# Patient Record
Sex: Female | Born: 1965 | Race: White | Hispanic: No | Marital: Married | Smoking: Current every day smoker
Health system: Southern US, Community
[De-identification: ages and names within clinical notes are randomized; demographics above are authoritative.]

## PROBLEM LIST (undated history)

## (undated) DIAGNOSIS — E785 Hyperlipidemia, unspecified: Secondary | ICD-10-CM

## (undated) DIAGNOSIS — K219 Gastro-esophageal reflux disease without esophagitis: Secondary | ICD-10-CM

## (undated) HISTORY — PX: CHOLECYSTECTOMY: SHX55

## (undated) HISTORY — PX: ABDOMINAL HYSTERECTOMY: SHX81

## (undated) HISTORY — PX: TONSILLECTOMY: SUR1361

---

## 2000-07-02 ENCOUNTER — Encounter: Admission: RE | Admit: 2000-07-02 | Discharge: 2000-07-02 | Payer: Self-pay | Admitting: Gastroenterology

## 2000-07-02 ENCOUNTER — Encounter: Payer: Self-pay | Admitting: Gastroenterology

## 2007-11-28 ENCOUNTER — Observation Stay (HOSPITAL_COMMUNITY): Admission: EM | Admit: 2007-11-28 | Discharge: 2007-11-29 | Payer: Self-pay | Admitting: Emergency Medicine
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
## 2011-07-17 LAB — DIFFERENTIAL
Basophils Relative: 0
Eosinophils Absolute: 0
Eosinophils Relative: 0
Monocytes Absolute: 2 — ABNORMAL HIGH
Neutro Abs: 24.9 — ABNORMAL HIGH
Neutrophils Relative %: 87 — ABNORMAL HIGH

## 2011-07-17 LAB — BASIC METABOLIC PANEL
CO2: 21
Chloride: 107
Creatinine, Ser: 0.72
GFR calc Af Amer: >60
Potassium: 3.5
Sodium: 137

## 2011-07-17 LAB — CBC
HCT: 42.5
Hemoglobin: 14.5
MCHC: 34.2
MCV: 91
RBC: 4.66
WBC: 28.6 — ABNORMAL HIGH

## 2011-07-17 LAB — SAMPLE TO BLOOD BANK

## 2017-11-19 ENCOUNTER — Other Ambulatory Visit: Payer: Self-pay | Admitting: Obstetrics & Gynecology

## 2017-11-19 DIAGNOSIS — R928 Other abnormal and inconclusive findings on diagnostic imaging of breast: Secondary | ICD-10-CM

## 2017-11-26 ENCOUNTER — Ambulatory Visit
Admission: RE | Admit: 2017-11-26 | Discharge: 2017-11-26 | Disposition: A | Discharge status: Home / Self Care | Payer: BLUE CROSS/BLUE SHIELD | Source: Ambulatory Visit | Attending: Obstetrics & Gynecology | Admitting: Obstetrics & Gynecology

## 2017-11-26 ENCOUNTER — Other Ambulatory Visit: Payer: Self-pay | Admitting: Obstetrics & Gynecology

## 2017-11-26 DIAGNOSIS — R928 Other abnormal and inconclusive findings on diagnostic imaging of breast: Secondary | ICD-10-CM

## 2017-11-26 DIAGNOSIS — N6320 Unspecified lump in the left breast, unspecified quadrant: Secondary | ICD-10-CM

## 2017-11-30 ENCOUNTER — Other Ambulatory Visit: Payer: Self-pay | Admitting: Obstetrics & Gynecology

## 2017-11-30 DIAGNOSIS — N63 Unspecified lump in unspecified breast: Secondary | ICD-10-CM

## 2018-05-26 ENCOUNTER — Inpatient Hospital Stay: Admission: RE | Admit: 2018-05-26 | Payer: BLUE CROSS/BLUE SHIELD | Source: Ambulatory Visit

## 2018-07-23 IMAGING — MG 2D DIGITAL DIAGNOSTIC BILATERAL MAMMOGRAM WITH CAD AND ADJUNCT T
8 of 9 series · 8 of 13 positions shown · non-contrast
Comparison: Previous exam(s).

CLINICAL DATA: 51-year-old female recalled from baseline screening
mammogram dated 11/17/2017 for bilateral breast masses.

EXAM:
2D DIGITAL DIAGNOSTIC BILATERAL MAMMOGRAM WITH CAD AND ADJUNCT TOMO
ULTRASOUND BILATERAL BREAST

[R CC]
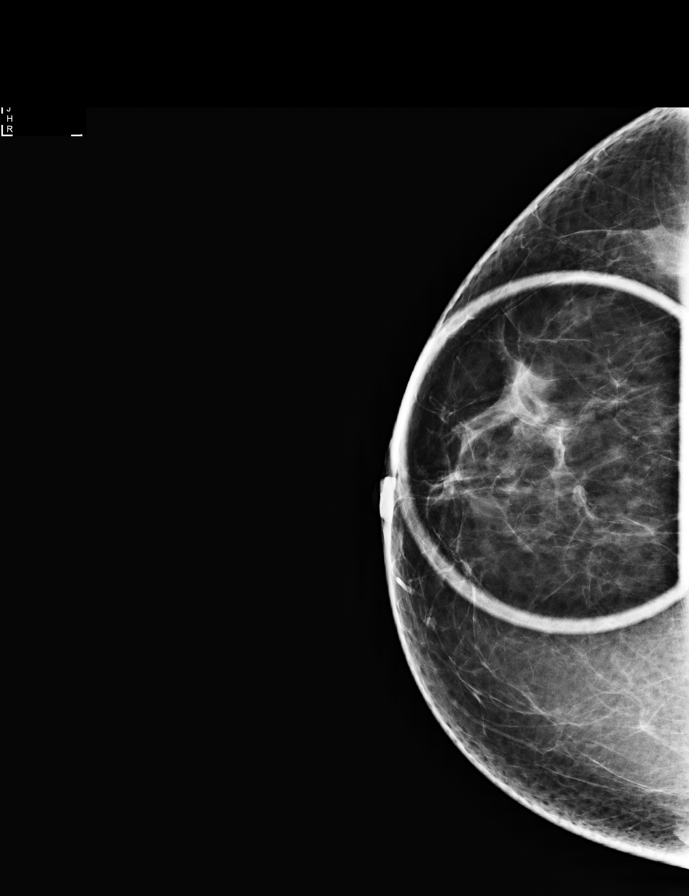

[L MLO]
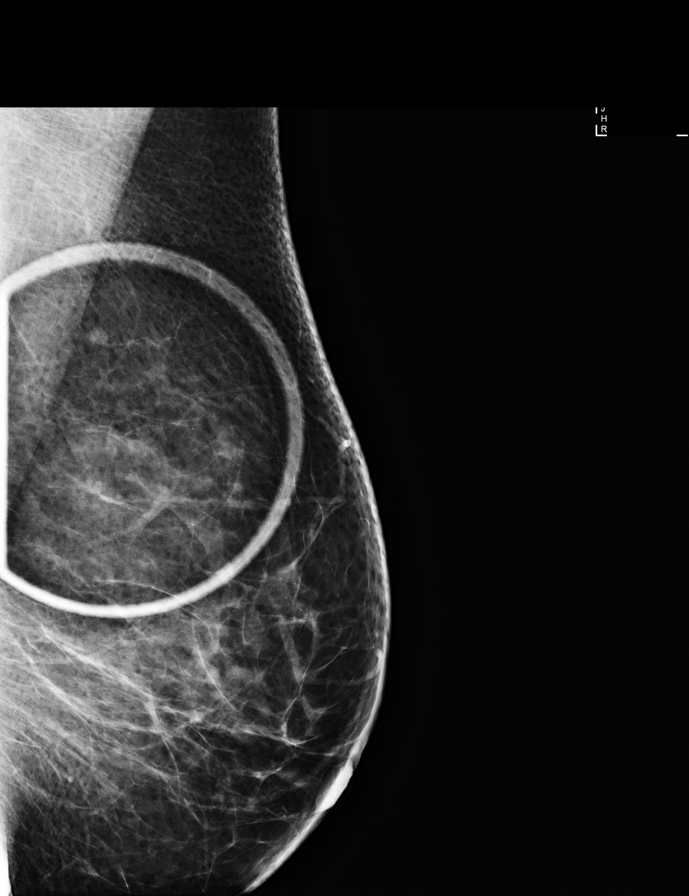

[L ML]
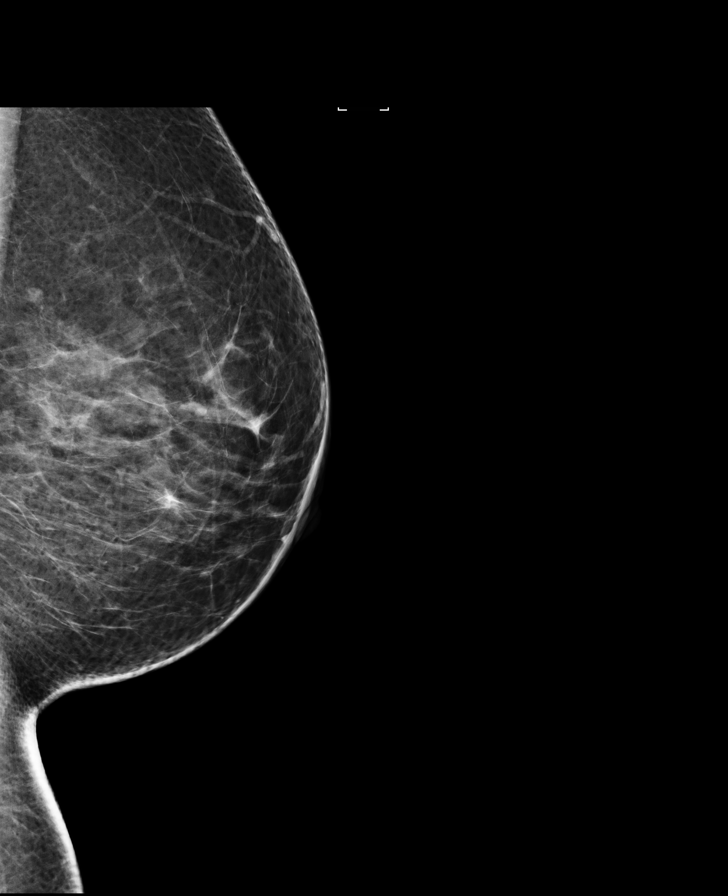

[L MLO synth-2D]
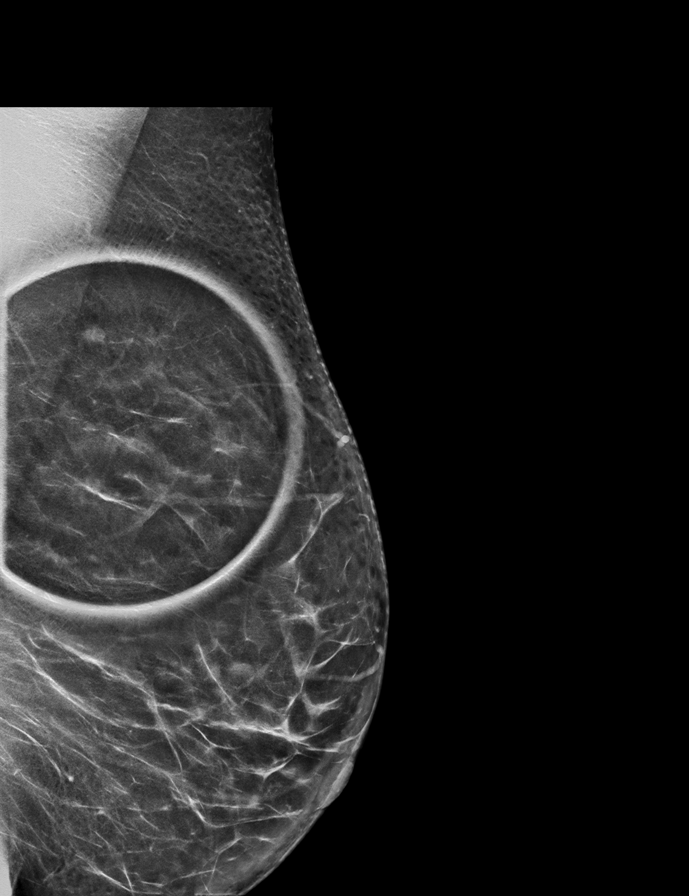

[R MLO synth-2D]
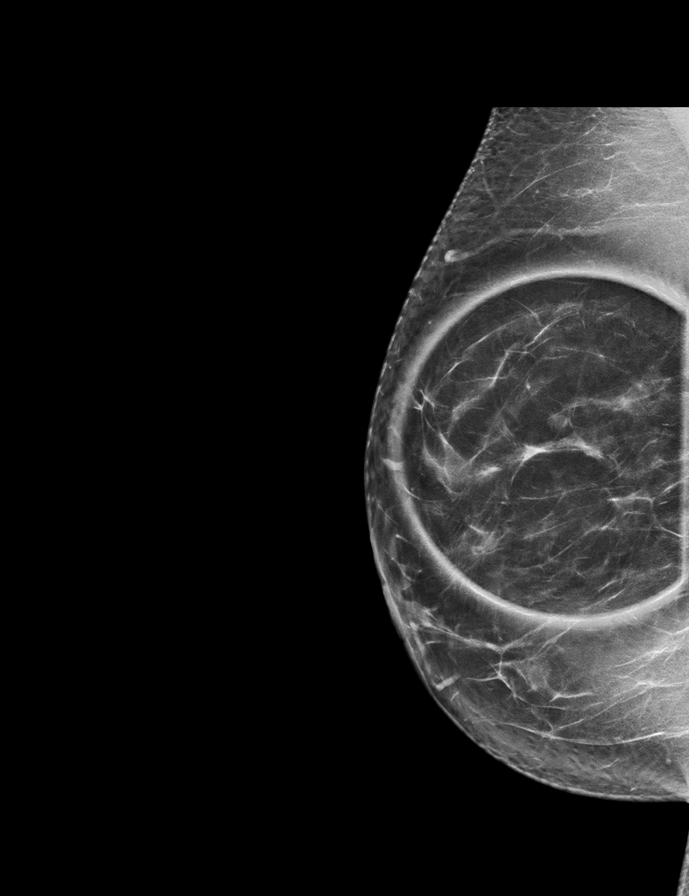

[L ML synth-2D]
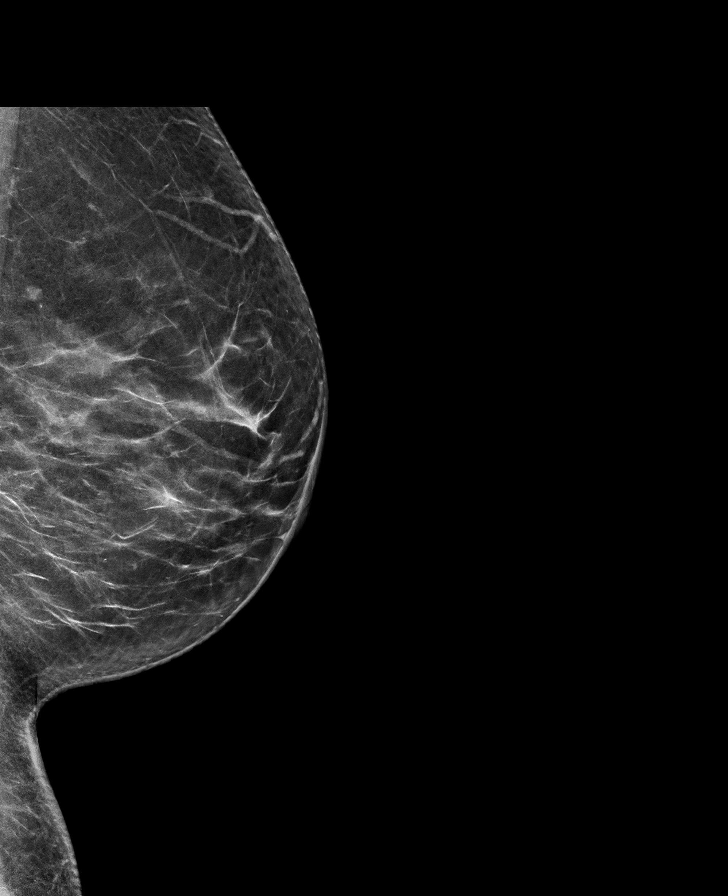

[R MLO]
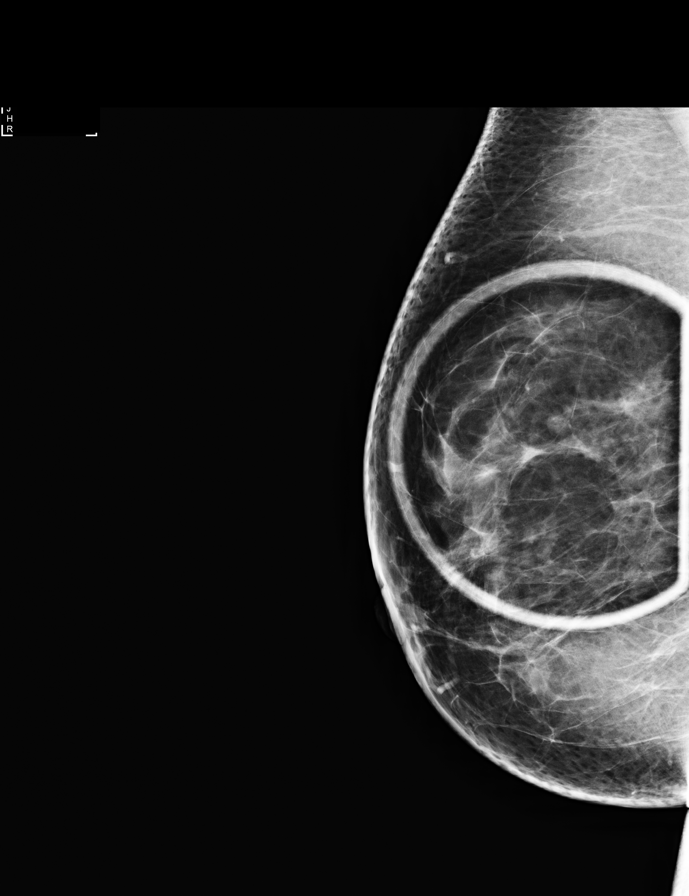

[R CC synth-2D]
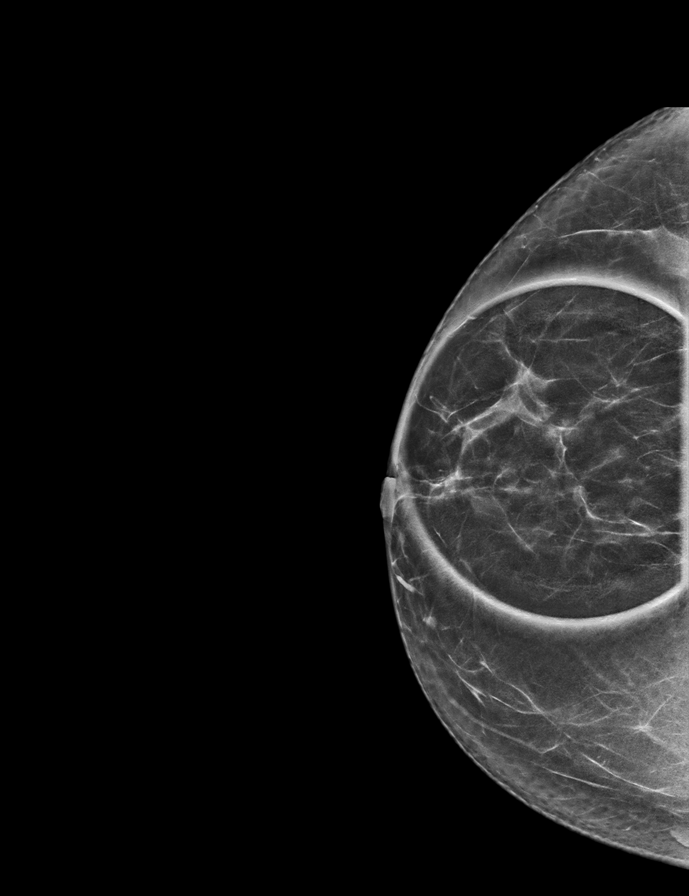

[8 of 13 positions shown; findings below may reference images not displayed]

ACR Breast Density Category b: There are scattered areas of
fibroglandular density.
FINDINGS: Oval, circumscribed equal density masses persist in the superior
central right breast at middle depth and superior left breast at far
posterior depth on the MLO projection. This localizes laterally on
tomosynthesis. Further evaluation of both areas was performed with
ultrasound.

Mammographic images were processed with CAD.

Targeted ultrasound is performed, showing a benign appearing nearly
anechoic cyst at the 12 o'clock position 3 cm from the nipple on the
right. It measures 0.5 x 0.3 x 0.3 cm. There is no internal
vascularity. This corresponds with the mammographic findings.

A probable complicated cyst is identified at within the left breast
at the 2 o'clock position 10 cm from the nipple. It measures 0.5 x
0.4 x 0.2 cm. There is no internal vascularity. This likely
corresponds with the mammographic finding. Extensive evaluation of
the far posterior upper outer quadrant demonstrates no additional
suspicious findings.
IMPRESSION: 1. Probably benign probable complicated cyst of the upper outer left
breast. Recommendation is for six-month mammographic and sonographic
follow-up.
2. Benign right breast cyst corresponding with the screening
mammographic findings. No further imaging follow-up required.

RECOMMENDATION:
Diagnostic left breast mammogram and ultrasound in 6 months.

I have discussed the findings and recommendations with the patient.
Results were also provided in writing at the conclusion of the
visit. If applicable, a reminder letter will be sent to the patient
regarding the next appointment.

BI-RADS CATEGORY  3: Probably benign.

## 2018-11-15 ENCOUNTER — Ambulatory Visit
Admission: RE | Admit: 2018-11-15 | Discharge: 2018-11-15 | Disposition: A | Discharge status: Home / Self Care | Payer: BLUE CROSS/BLUE SHIELD | Source: Ambulatory Visit | Attending: Obstetrics & Gynecology | Admitting: Obstetrics & Gynecology

## 2018-11-15 ENCOUNTER — Other Ambulatory Visit: Payer: Self-pay | Admitting: Obstetrics & Gynecology

## 2018-11-15 ENCOUNTER — Ambulatory Visit: Payer: BLUE CROSS/BLUE SHIELD

## 2018-11-15 DIAGNOSIS — N6320 Unspecified lump in the left breast, unspecified quadrant: Secondary | ICD-10-CM

## 2018-11-15 DIAGNOSIS — N63 Unspecified lump in unspecified breast: Secondary | ICD-10-CM

## 2018-11-18 ENCOUNTER — Other Ambulatory Visit: Payer: Self-pay | Admitting: Obstetrics & Gynecology

## 2018-11-18 DIAGNOSIS — N63 Unspecified lump in unspecified breast: Secondary | ICD-10-CM

## 2018-11-22 ENCOUNTER — Ambulatory Visit
Admission: RE | Admit: 2018-11-22 | Discharge: 2018-11-22 | Disposition: A | Discharge status: Home / Self Care | Payer: BLUE CROSS/BLUE SHIELD | Source: Ambulatory Visit | Attending: Obstetrics & Gynecology | Admitting: Obstetrics & Gynecology

## 2018-11-22 ENCOUNTER — Ambulatory Visit: Payer: BLUE CROSS/BLUE SHIELD

## 2018-11-22 DIAGNOSIS — N63 Unspecified lump in unspecified breast: Secondary | ICD-10-CM

## 2019-07-19 IMAGING — MG DIGITAL DIAGNOSTIC BILATERAL MAMMOGRAM WITH TOMO AND CAD
2 series · 3 of 6 positions shown · non-contrast
Comparison: Previous exam(s).

CLINICAL DATA: Delayed follow-up for a probably benign left breast
mass.

EXAM:
DIGITAL DIAGNOSTIC BILATERAL MAMMOGRAM WITH CAD AND TOMO

[L CC synth-2D]
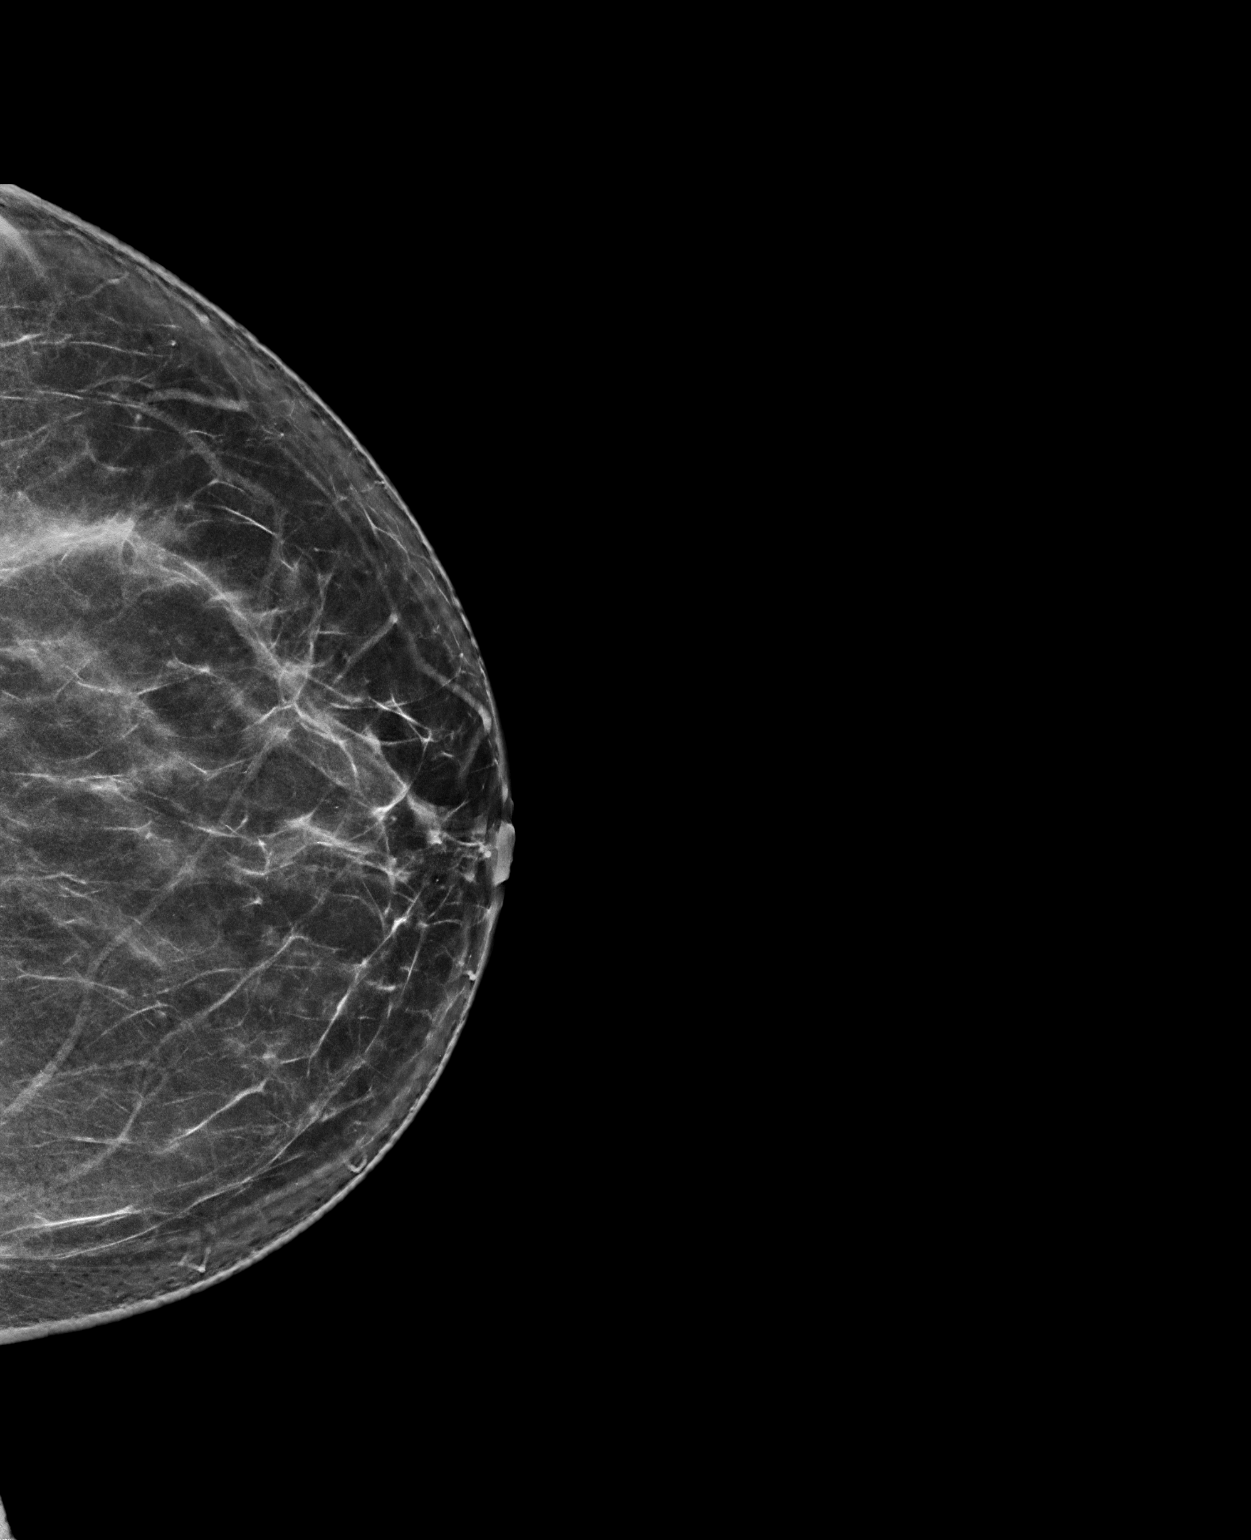

[R MLO tomo · 2 of 88 frames shown]
[frame 29/88]
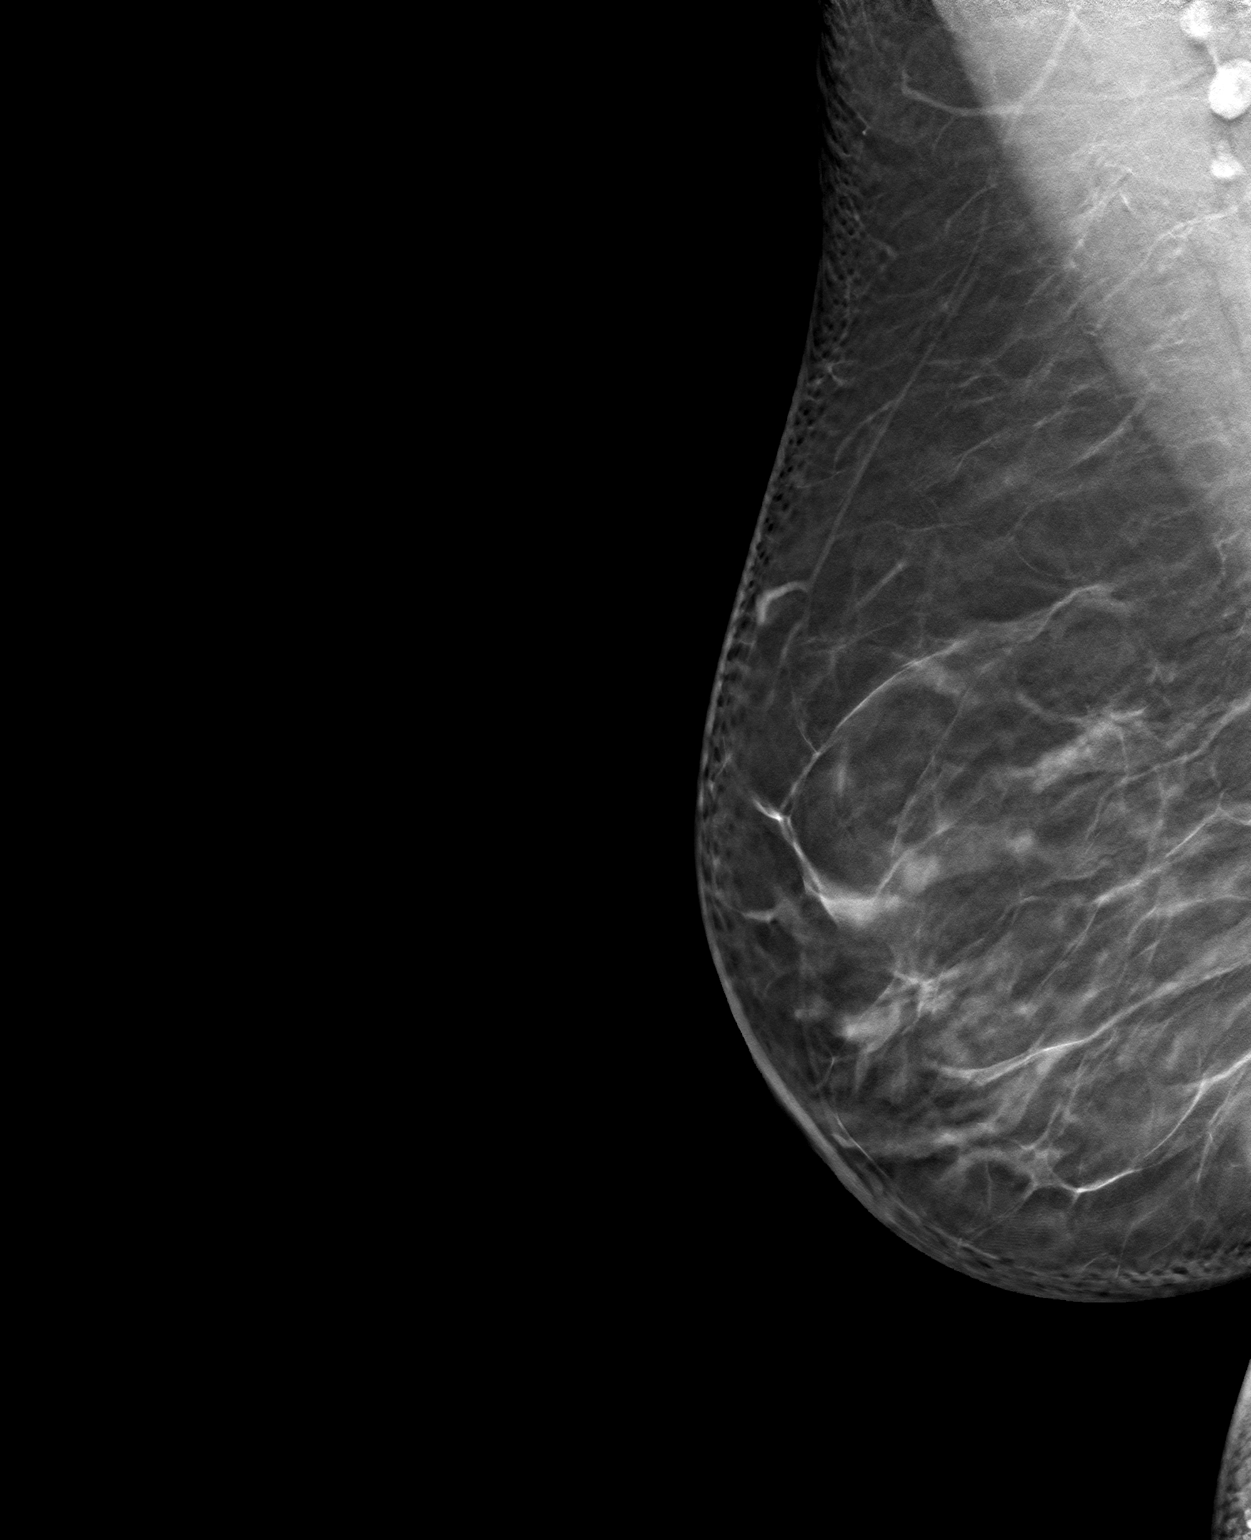
[frame 45/88]
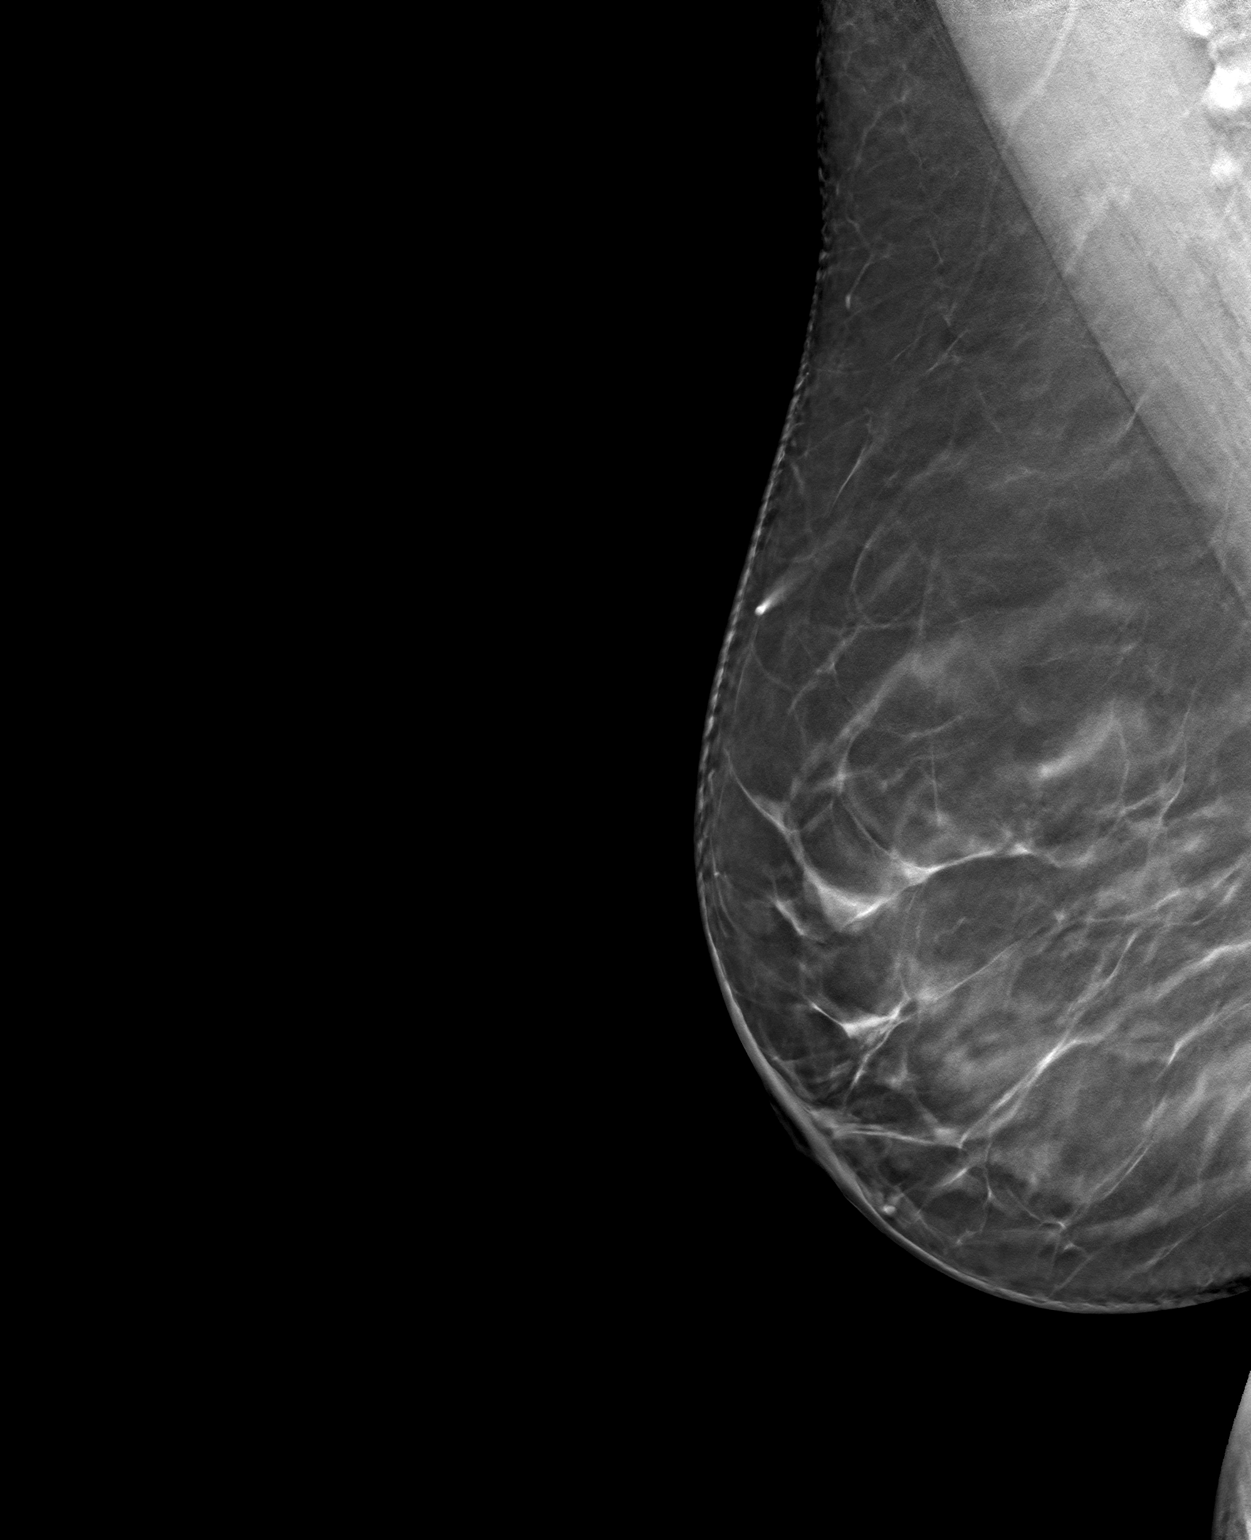

[3 of 6 positions shown; findings below may reference images not displayed]

ACR Breast Density Category b: There are scattered areas of
fibroglandular density.
FINDINGS: The small mass previously seen in the upper-outer quadrant of the
left breast has resolved. No suspicious calcifications, masses or
areas of distortion are seen in the bilateral breasts.

Mammographic images were processed with CAD.
IMPRESSION: 1.  Resolution of the small mass in the upper-outer left breast.

2.  No mammographic evidence of malignancy in the bilateral breasts.

RECOMMENDATION:
Screening mammogram in one year.(Code:[id number])

I have discussed the findings and recommendations with the patient.
Results were also provided in writing at the conclusion of the
visit. If applicable, a reminder letter will be sent to the patient
regarding the next appointment.

BI-RADS CATEGORY  1: Negative.

## 2022-07-13 ENCOUNTER — Emergency Department (HOSPITAL_COMMUNITY)
Admission: EM | Admit: 2022-07-13 | Discharge: 2022-07-14 | Discharge status: Against Medical Advice | Payer: BC Managed Care – PPO | Attending: Physician Assistant | Admitting: Physician Assistant

## 2022-07-13 ENCOUNTER — Encounter (HOSPITAL_COMMUNITY): Payer: Self-pay

## 2022-07-13 ENCOUNTER — Other Ambulatory Visit: Payer: Self-pay

## 2022-07-13 ENCOUNTER — Emergency Department (HOSPITAL_COMMUNITY): Payer: BC Managed Care – PPO

## 2022-07-13 ENCOUNTER — Ambulatory Visit
Admission: EM | Admit: 2022-07-13 | Discharge: 2022-07-13 | Disposition: A | Discharge status: Home / Self Care | Payer: BC Managed Care – PPO

## 2022-07-13 DIAGNOSIS — R059 Cough, unspecified: Secondary | ICD-10-CM | POA: Insufficient documentation

## 2022-07-13 DIAGNOSIS — R2243 Localized swelling, mass and lump, lower limb, bilateral: Secondary | ICD-10-CM | POA: Insufficient documentation

## 2022-07-13 DIAGNOSIS — R509 Fever, unspecified: Secondary | ICD-10-CM | POA: Insufficient documentation

## 2022-07-13 DIAGNOSIS — R0789 Other chest pain: Secondary | ICD-10-CM | POA: Insufficient documentation

## 2022-07-13 DIAGNOSIS — Z5321 Procedure and treatment not carried out due to patient leaving prior to being seen by health care provider: Secondary | ICD-10-CM | POA: Diagnosis not present

## 2022-07-13 DIAGNOSIS — R079 Chest pain, unspecified: Secondary | ICD-10-CM | POA: Diagnosis present

## 2022-07-13 DIAGNOSIS — R0602 Shortness of breath: Secondary | ICD-10-CM | POA: Diagnosis not present

## 2022-07-13 DIAGNOSIS — R000 Tachycardia, unspecified: Secondary | ICD-10-CM | POA: Diagnosis not present

## 2022-07-13 HISTORY — DX: Hyperlipidemia, unspecified: E78.5

## 2022-07-13 HISTORY — DX: Gastro-esophageal reflux disease without esophagitis: K21.9

## 2022-07-13 LAB — BASIC METABOLIC PANEL
Anion gap: 11 (ref 5–15)
BUN: 9 mg/dL (ref 6–20)
CO2: 24 mmol/L (ref 22–32)
Calcium: 9.4 mg/dL (ref 8.9–10.3)
Chloride: 104 mmol/L (ref 98–111)
Creatinine, Ser: 0.83 mg/dL (ref 0.44–1.00)
GFR, Estimated: >60 mL/min (ref >60)
Glucose, Bld: 145 mg/dL — ABNORMAL HIGH (ref 70–99)
Potassium: 3.9 mmol/L (ref 3.5–5.1)
Sodium: 139 mmol/L (ref 135–145)

## 2022-07-13 LAB — CBC WITH DIFFERENTIAL/PLATELET
Abs Immature Granulocytes: 0.07 10*3/uL (ref 0.00–0.07)
Basophils Absolute: 0.1 10*3/uL (ref 0.0–0.1)
Basophils Relative: 1 %
Eosinophils Absolute: 0.2 10*3/uL (ref 0.0–0.5)
Eosinophils Relative: 2 %
HCT: 41.9 % (ref 36.0–46.0)
Hemoglobin: 13.5 g/dL (ref 12.0–15.0)
Immature Granulocytes: 1 %
Lymphocytes Relative: 25 %
Lymphs Abs: 2.5 10*3/uL (ref 0.7–4.0)
MCH: 29.9 pg (ref 26.0–34.0)
MCHC: 32.2 g/dL (ref 30.0–36.0)
MCV: 92.7 fL (ref 80.0–100.0)
Monocytes Absolute: 0.6 10*3/uL (ref 0.1–1.0)
Monocytes Relative: 6 %
Neutro Abs: 6.5 10*3/uL (ref 1.7–7.7)
Neutrophils Relative %: 65 %
Platelets: 270 10*3/uL (ref 150–400)
RBC: 4.52 MIL/uL (ref 3.87–5.11)
RDW: 13.2 % (ref 11.5–15.5)
WBC: 9.9 10*3/uL (ref 4.0–10.5)
nRBC: 0 % (ref 0.0–0.2)

## 2022-07-13 LAB — TROPONIN I (HIGH SENSITIVITY)
Troponin I (High Sensitivity): 10 ng/L (ref <18)
Troponin I (High Sensitivity): 8 ng/L (ref <18)

## 2022-07-13 LAB — BRAIN NATRIURETIC PEPTIDE: B Natriuretic Peptide: 20.8 pg/mL (ref 0.0–100.0)

## 2022-07-13 NOTE — ED Notes (Signed)
Patient is being discharged from the Urgent Care and sent to the Emergency Department via EMS . Per Hildred Alamin, patient is in need of higher level of care due to CP/SOB. Patient is aware and verbalizes understanding of plan of care.  Vitals:   07/13/22 1546  BP: (!) 171/84  Pulse: (!) 118  Resp: 20  Temp: 99.2 F (37.3 C)  SpO2: 97%

## 2022-07-13 NOTE — ED Notes (Signed)
Contacting ems per provider

## 2022-07-13 NOTE — ED Triage Notes (Signed)
CP SOB x 2 weeks 18g L hand, received 324 ASA 2 nitro dx with sinus infection x 10 days takes antibiotics worse with exertion

## 2022-07-13 NOTE — Discharge Instructions (Addendum)
Patient sent to hospital via EMS.  

## 2022-07-13 NOTE — ED Notes (Signed)
Patient arm band was placed on the desk and registration states this person left

## 2022-07-13 NOTE — ED Provider Triage Note (Signed)
Emergency Medicine Provider Triage Evaluation Note  Monica Palmer , a 56 y.o. female  was evaluated in triage.  Pt complains of chest pain and shortness of breath.  Initially started out as a cough 2 weeks ago.  Was seen by her PCP who prescribed her medications for possible bronchitis.  She has chest tightness.  She feels like she has bilateral lower extremity swelling at her ankles.  Feels like there is something sitting on her chest.  Pain does not radiate.  Review of Systems  Positive: CP, SOB, LE swelling Negative: fever  Physical Exam  There were no vitals taken for this visit. Gen:   Awake, no distress   Resp:  Normal effort  MSK:   Moves extremities without difficulty  Other:    Medical Decision Making  Medically screening exam initiated at 5:39 PM.  Appropriate orders placed.  Monica Palmer was informed that the remainder of the evaluation will be completed by another provider, this initial triage assessment does not replace that evaluation, and the importance of remaining in the ED until their evaluation is complete.  CP, SOB, le swelling   Solace Manwarren A, PA-C 07/13/22 1740

## 2022-07-13 NOTE — ED Triage Notes (Signed)
Pt c/o chest pain, sob, hot flashes, onset ~ 2 weeks ago has been intermittent but progressively worse.

## 2022-07-13 NOTE — ED Provider Notes (Signed)
[facility]    CSN: [id number] Arrival date & time: 07/13/22  1533      History   Chief Complaint Chief Complaint  Patient presents with   Chest Pain    HPI Monica Palmer is a 56 y.o. female.   Patient presents with chest pain, shortness of breath, "hot flashes" that started about 2 weeks ago.  Patient reports that symptoms are worsening and becoming more persistent and frequent.  Patient reports chest pain is in the center of the chest and feels like someone is "sitting on her chest".  Patient reports that she has had chest pain in the past with palpitations but states that this "feels different".  Also having some shortness of breath where she appears tachypneic in exam room.  Patient reports that she has a history of chronic bronchitis and takes albuterol inhalers but is not sure the name of them.  When symptoms first started, patient had upper respiratory symptoms and cough where she was treated with prednisone and antibiotics.  Patient reports symptoms worsened after taking these medications.  Denies any known fevers at home.  Patient takes metoprolol for tachycardia and has taken her medication as prescribed.   Chest Pain   Past Medical History:  Diagnosis Date   Acid reflux    Hyperlipemia     There are no problems to display for this patient.   History reviewed. No pertinent surgical history.  OB History   No obstetric history on file.      Home Medications    Prior to Admission medications   Medication Sig Start Date End Date Taking? Authorizing Provider  albuterol (PROVENTIL) (2.5 MG/3ML) 0.083% nebulizer solution Inhale into the lungs. 06/22/19  Yes [provider]  ALPRAZolam Duanne Moron) 0.5 MG tablet PLEASE SEE ATTACHED FOR DETAILED DIRECTIONS 07/23/21  Yes [provider]  amphetamine-dextroamphetamine (ADDERALL XR) 15 MG 24 hr capsule Take 1 tablet by mouth daily. 06/26/22  Yes [provider]  escitalopram  (LEXAPRO) 10 MG tablet Take 1 tablet by mouth daily. 10/24/21  Yes [provider]  metoprolol succinate (TOPROL-XL) 50 MG 24 hr tablet Take 1 tablet by mouth 2 (two) times daily. 06/12/22  Yes [provider]  aspirin EC 81 MG tablet Take by mouth.    [provider]  azithromycin (ZITHROMAX) 500 MG tablet Take 500 mg by mouth daily. 06/26/22   [provider]  predniSONE (DELTASONE) 10 MG tablet Take by mouth. 06/26/22   [provider]    Family History Family History  Problem Relation Age of Onset   Breast cancer Neg Hx     Social History Social History   Tobacco Use   Smoking status: Every Day    Types: Cigarettes   Smokeless tobacco: Never     Allergies   Patient has no allergy information on record.   Review of Systems Review of Systems Per HPI  Physical Exam Triage Vital Signs ED Triage Vitals  Enc Vitals Group     BP 07/13/22 1546 (!) 171/84     Pulse Rate 07/13/22 1546 (!) 118     Resp 07/13/22 1546 20     Temp 07/13/22 1546 99.2 F (37.3 C)     Temp Source 07/13/22 1546 Oral     SpO2 07/13/22 1546 97 %     Weight --      Height --      Head Circumference --      Peak Flow --  Pain Score 07/13/22 1547 8     Pain Loc --      Pain Edu? --      Excl. in GC? --    No data found.  Updated Vital Signs BP (!) 171/84 (BP Location: Left Arm)   Pulse (!) 118   Temp 99.2 F (37.3 C) (Oral)   Resp 20   SpO2 97%   Visual Acuity Right Eye Distance:   Left Eye Distance:   Bilateral Distance:    Right Eye Near:   Left Eye Near:    Bilateral Near:     Physical Exam Constitutional:      General: She is in acute distress.     Appearance: Normal appearance. She is ill-appearing and toxic-appearing. She is not diaphoretic.  HENT:     Head: Normocephalic and atraumatic.  Eyes:     Extraocular Movements: Extraocular movements intact.     Conjunctiva/sclera: Conjunctivae normal.  Cardiovascular:     Rate and  Rhythm: Regular rhythm. Tachycardia present.     Pulses: Normal pulses.     Heart sounds: Normal heart sounds.  Pulmonary:     Breath sounds: Rhonchi present.     Comments: Tachypnea noted.  Neurological:     General: No focal deficit present.     Mental Status: She is alert and oriented to person, place, and time. Mental status is at baseline.  Psychiatric:        Mood and Affect: Mood normal.        Behavior: Behavior normal.        Thought Content: Thought content normal.        Judgment: Judgment normal.      UC Treatments / Results  Labs (all labs ordered are listed, but only abnormal results are displayed) Labs Reviewed - No data to display  EKG   Radiology No results found.  Procedures Procedures (including critical care time)  Medications Ordered in UC Medications - No data to display  Initial Impression / Assessment and Plan / UC Course  I have reviewed the triage vital signs and the nursing notes.  Pertinent labs & imaging results that were available during my care of the patient were reviewed by me and considered in my medical decision making (see chart for details).     EKG showing sinus tachycardia.  She is also very tachypneic on exam and has rhonchi lung sounds.  Patient's oxygen is normal but blood pressure is elevated.  Patient also has elevated heart rate.  Given abnormal vital signs, tachypnea noted on exam, chest pain, so do think this warrants further evaluation and management at the hospital.  Advised patient that she will need to go to the hospital for further evaluation and management via EMS and patient was agreeable.  Patient left via EMS transport. Final Clinical Impressions(s) / UC Diagnoses   Final diagnoses:  Shortness of breath  Sinus tachycardia  Other chest pain     Discharge Instructions      Patient sent to hospital via EMS.    ED Prescriptions   None    PDMP not reviewed this encounter.   Gustavus Bryant,  [place] 07/13/22 1616

## 2022-07-17 ENCOUNTER — Ambulatory Visit
Admission: RE | Admit: 2022-07-17 | Discharge: 2022-07-17 | Disposition: A | Discharge status: Home / Self Care | Payer: BC Managed Care – PPO | Source: Ambulatory Visit | Attending: Specialist | Admitting: Specialist

## 2022-07-17 ENCOUNTER — Other Ambulatory Visit: Payer: Self-pay | Admitting: Specialist

## 2022-07-17 DIAGNOSIS — R0600 Dyspnea, unspecified: Secondary | ICD-10-CM

## 2022-07-17 MED ORDER — IOPAMIDOL (ISOVUE-300) INJECTION 61%
75.0000 mL | Freq: Once | INTRAVENOUS | Status: AC | PRN
  Administered 2022-07-17: 75 mL via INTRAVENOUS

## 2023-07-26 ENCOUNTER — Other Ambulatory Visit: Payer: Self-pay

## 2023-07-26 ENCOUNTER — Emergency Department (HOSPITAL_BASED_OUTPATIENT_CLINIC_OR_DEPARTMENT_OTHER)
Admission: EM | Admit: 2023-07-26 | Discharge: 2023-07-26 | Disposition: A | Discharge status: Home / Self Care | Payer: BLUE CROSS/BLUE SHIELD | Attending: Emergency Medicine | Admitting: Emergency Medicine

## 2023-07-26 ENCOUNTER — Encounter (HOSPITAL_BASED_OUTPATIENT_CLINIC_OR_DEPARTMENT_OTHER): Payer: Self-pay | Admitting: Emergency Medicine

## 2023-07-26 DIAGNOSIS — Z20822 Contact with and (suspected) exposure to covid-19: Secondary | ICD-10-CM | POA: Diagnosis not present

## 2023-07-26 DIAGNOSIS — R1030 Lower abdominal pain, unspecified: Secondary | ICD-10-CM | POA: Diagnosis not present

## 2023-07-26 DIAGNOSIS — R197 Diarrhea, unspecified: Secondary | ICD-10-CM | POA: Insufficient documentation

## 2023-07-26 DIAGNOSIS — Z7982 Long term (current) use of aspirin: Secondary | ICD-10-CM | POA: Diagnosis not present

## 2023-07-26 LAB — RESP PANEL BY RT-PCR (RSV, FLU A&B, COVID)  RVPGX2
Influenza A by PCR: NEGATIVE
Influenza B by PCR: NEGATIVE
Resp Syncytial Virus by PCR: NEGATIVE
SARS Coronavirus 2 by RT PCR: NEGATIVE

## 2023-07-26 LAB — CBC
HCT: 46.4 % — ABNORMAL HIGH (ref 36.0–46.0)
Hemoglobin: 15.2 g/dL — ABNORMAL HIGH (ref 12.0–15.0)
MCH: 29.3 pg (ref 26.0–34.0)
MCHC: 32.8 g/dL (ref 30.0–36.0)
MCV: 89.4 fL (ref 80.0–100.0)
Platelets: 267 10*3/uL (ref 150–400)
RBC: 5.19 MIL/uL — ABNORMAL HIGH (ref 3.87–5.11)
RDW: 13.3 % (ref 11.5–15.5)
WBC: 11.8 10*3/uL — ABNORMAL HIGH (ref 4.0–10.5)
nRBC: 0 % (ref 0.0–0.2)

## 2023-07-26 LAB — COMPREHENSIVE METABOLIC PANEL
ALT: 13 U/L (ref 0–44)
AST: 26 U/L (ref 15–41)
Albumin: 4.3 g/dL (ref 3.5–5.0)
Alkaline Phosphatase: 95 U/L (ref 38–126)
Anion gap: 9 (ref 5–15)
BUN: 9 mg/dL (ref 6–20)
CO2: 29 mmol/L (ref 22–32)
Calcium: 9.4 mg/dL (ref 8.9–10.3)
Chloride: 104 mmol/L (ref 98–111)
Creatinine, Ser: 0.79 mg/dL (ref 0.44–1.00)
GFR, Estimated: >60 mL/min (ref >60)
Glucose, Bld: 121 mg/dL — ABNORMAL HIGH (ref 70–99)
Potassium: 4.9 mmol/L (ref 3.5–5.1)
Sodium: 142 mmol/L (ref 135–145)
Total Bilirubin: 0.4 mg/dL (ref 0.3–1.2)
Total Protein: 7.3 g/dL (ref 6.5–8.1)

## 2023-07-26 LAB — URINALYSIS, ROUTINE W REFLEX MICROSCOPIC
Bilirubin Urine: NEGATIVE
Glucose, UA: NEGATIVE mg/dL
Ketones, ur: NEGATIVE mg/dL
Nitrite: NEGATIVE
Specific Gravity, Urine: 1.03 (ref 1.005–1.030)
pH: 5.5 (ref 5.0–8.0)

## 2023-07-26 LAB — LIPASE, BLOOD: Lipase: 29 U/L (ref 11–51)

## 2023-07-26 MED ORDER — SODIUM CHLORIDE 0.9 % IV BOLUS
1000.0000 mL | Freq: Once | INTRAVENOUS | Status: AC
  Administered 2023-07-26: 1000 mL via INTRAVENOUS

## 2023-07-26 NOTE — Discharge Instructions (Signed)
You have been seen today for your complaint of diarrhea. Your lab work was reassuring. Home care instructions are as follows:  Drink plenty of water.  Eat a bland diet including soups, broths, bananas, rice for a few days until you return to normal bowel habits Follow up with: Your PCP in 1 week for reevaluation Please seek immediate medical care if you develop any of the following symptoms: You have chest pain or your heart is beating very quickly. You have trouble breathing or you are breathing very quickly. You feel extremely weak or you faint. At this time there does not appear to be the presence of an emergent medical condition, however there is always the potential for conditions to change. Please read and follow the below instructions.  Do not take your medicine if  develop an itchy rash, swelling in your mouth or lips, or difficulty breathing; call 911 and seek immediate emergency medical attention if this occurs.  You may review your lab tests and imaging results in their entirety on your MyChart account.  Please discuss all results of fully with your primary care provider and other specialist at your follow-up visit.  Note: Portions of this text may have been transcribed using voice recognition software. Every effort was made to ensure accuracy; however, inadvertent computerized transcription errors may still be present.

## 2023-07-26 NOTE — ED Triage Notes (Signed)
"  I feel dehydrated" Diarrhea x 4 day. No nausea vomiting, but "as soon as I eat or drink it goes right thru me"

## 2023-07-26 NOTE — ED Provider Notes (Signed)
[name] EMERGENCY DEPARTMENT AT [facility] Provider Note   CSN: [id number] Arrival date & time: 07/26/23  1653     History  Chief Complaint  Patient presents with   Diarrhea    Monica Palmer is a 57 y.o. female.  With history of hyperlipidemia, cholecystectomy, hysterectomy, appendectomy presenting to the ED for evaluation of diarrhea.  She states she has had diarrhea for the past 4 days.  The diarrhea has gotten significantly better since it began.  She had 1 episode of diarrhea yesterday and 4 episodes today.  She denies any melena or medic easier but states that it does look darker in color.  She reports taking Pepto-Bismol with some improvement in her symptoms.  She denies any nausea or vomiting.  No urinary symptoms or vaginal symptoms.  No recent hospital admissions or antibiotic use.  No recent travel.  She is unaware of any potential contaminated food.  She denies any fevers.  She does report some mild lower abdominal pain with the diarrhea.   Diarrhea      Home Medications Prior to Admission medications   Medication Sig Start Date End Date Taking? Authorizing Provider  albuterol (PROVENTIL) (2.5 MG/3ML) 0.083% nebulizer solution Inhale into the lungs. 06/22/19   [provider]  ALPRAZolam Prudy Feeler) 0.5 MG tablet PLEASE SEE ATTACHED FOR DETAILED DIRECTIONS 07/23/21   [provider]  amphetamine-dextroamphetamine (ADDERALL XR) 15 MG 24 hr capsule Take 1 tablet by mouth daily. 06/26/22   [provider]  aspirin EC 81 MG tablet Take by mouth.    [provider]  azithromycin (ZITHROMAX) 500 MG tablet Take 500 mg by mouth daily. 06/26/22   [provider]  escitalopram (LEXAPRO) 10 MG tablet Take 1 tablet by mouth daily. 10/24/21   [provider]  metoprolol succinate (TOPROL-XL) 50 MG 24 hr tablet Take 1 tablet by mouth 2 (two) times daily. 06/12/22   [provider]  predniSONE (DELTASONE) 10 MG  tablet Take by mouth. 06/26/22   [provider]      Allergies    Patient has no known allergies.    Review of Systems   Review of Systems  Gastrointestinal:  Positive for diarrhea.  All other systems reviewed and are negative.   Physical Exam Updated Vital Signs BP 128/66   Pulse 96   Temp 98.3 F (36.8 C) (Oral)   Resp 16   SpO2 95%  Physical Exam Vitals and nursing note reviewed.  Constitutional:      General: She is not in acute distress.    Appearance: She is well-developed.  HENT:     Head: Normocephalic and atraumatic.  Eyes:     Conjunctiva/sclera: Conjunctivae normal.  Cardiovascular:     Rate and Rhythm: Normal rate and regular rhythm.     Heart sounds: No murmur heard. Pulmonary:     Effort: Pulmonary effort is normal. No respiratory distress.     Breath sounds: Normal breath sounds.  Abdominal:     Palpations: Abdomen is soft.     Tenderness: There is abdominal tenderness (Lower abdomen). There is no guarding.  Musculoskeletal:        General: No swelling.     Cervical back: Neck supple.  Skin:    General: Skin is warm and dry.     Capillary Refill: Capillary refill takes less than 2 seconds.  Neurological:     Mental Status: She is alert.  Psychiatric:        Mood  and Affect: Mood normal.     ED Results / Procedures / Treatments   Labs (all labs ordered are listed, but only abnormal results are displayed) Labs Reviewed  COMPREHENSIVE METABOLIC PANEL - Abnormal; Notable for the following components:      Result Value   Glucose, Bld 121 (*)    All other components within normal limits  CBC - Abnormal; Notable for the following components:   WBC 11.8 (*)    RBC 5.19 (*)    Hemoglobin 15.2 (*)    HCT 46.4 (*)    All other components within normal limits  URINALYSIS, ROUTINE W REFLEX MICROSCOPIC - Abnormal; Notable for the following components:   APPearance HAZY (*)    Hgb urine dipstick SMALL (*)    Protein, ur TRACE (*)     Leukocytes,Ua TRACE (*)    Bacteria, UA MANY (*)    All other components within normal limits  RESP PANEL BY RT-PCR (RSV, FLU A&B, COVID)  RVPGX2  LIPASE, BLOOD    EKG None  Radiology No results found.  Procedures Procedures    Medications Ordered in ED Medications  sodium chloride 0.9 % bolus 1,000 mL (0 mLs Intravenous Stopped 07/26/23 2118)    ED Course/ Medical Decision Making/ A&P                                 Medical Decision Making Amount and/or Complexity of Data Reviewed Labs: ordered.   This patient presents to the ED for concern of diarrhea, this involves an extensive number of treatment options, and is a complaint that carries with it a high risk of complications and morbidity.  The differential diagnosis of diarrhea includes but is not limited to Viral- norovirus/rotavirus; Bacterial-Campylobacter,Shigella, Salmonella, Escherichia coli, E. coli 0157:H7, Yersinia enterocolitica, Vibrio cholerae, Clostridium difficile. Parasitic- Giardia lamblia, Cryptosporidium,Entamoeba histolytica,Cyclospora, Microsporidium. Toxin- Staphylococcus aureus, Bacillus cereus. Noninfectious causes include GI Bleed, Appendicitis, Mesenteric Ischemia, Diverticulitis, Adrenal Crisis, Thyroid Storm, Toxicologic exposures, Antibiotic or drug-associated, inflammatory bowel disease.   My initial workup includes labs, fluids  Additional history obtained from: Nursing notes from this visit. Family husband at bedside provides a portion of the history  I ordered, reviewed and interpreted labs which include: CBC, CMP, lipase, urinalysis, respiratory panel.  Normal electrolytes and kidney function.  Leukocytosis of 11.8, hemoconcentration with hemoglobin of 15.2.  Urinalysis with a specific gravity of 1.030.  Does appear to be contaminated with 11-20 squamous epithelial cells  Cardiac Monitoring:  The patient was maintained on a cardiac monitor.  I personally viewed and interpreted the  cardiac monitored which showed an underlying rhythm of: NSR  Afebrile, hemodynamically stable.  57 year old female presenting for evaluation of diarrhea.  Symptoms have been present for the past 4 days, however have been continuously improving.  She had 4 episodes today.  She was reporting some dark stools but states that she took Pepto-Bismol.  No risk factors for C. difficile.  No recent travel or contaminated water ingestion.  Lab workup reassuring today.  Urine does show evidence of dehydration, however kidney function is maintained.  She was tachycardic on arrival, however states that her typical heart rate is between 95 and 105 and she takes metoprolol for this.  She was reporting some mild lower abdominal pain, however has a reassuring physical exam without peritoneal signs and has a history of appendectomy.  Patient was given IV fluids and reported improvement in her symptoms.  Stool testing was offered but declined.  Patient was encouraged to drink plenty of water and to eat a bland diet.  She was encouraged to follow-up with her PCP if her symptoms do not improve in the next couple days.  She was given return precautions.  Stable at discharge.  At this time there does not appear to be any evidence of an acute emergency medical condition and the patient appears stable for discharge with appropriate outpatient follow up. Diagnosis was discussed with patient who verbalizes understanding of care plan and is agreeable to discharge. I have discussed return precautions with patient and husband who verbalizes understanding. Patient encouraged to follow-up with their PCP within 3 days. All questions answered.  Note: Portions of this report may have been transcribed using voice recognition software. Every effort was made to ensure accuracy; however, inadvertent computerized transcription errors may still be present.        Final Clinical Impression(s) / ED Diagnoses Final diagnoses:  Diarrhea,  unspecified type    Rx / DC Orders ED Discharge Orders     None         Michelle Piper, Cordelia Poche 07/26/23 2201    Alvira Monday, MD 07/28/23 1330

## 2023-07-26 NOTE — ED Notes (Signed)
Reviewed AVS/discharge instruction with patient. Time allotted for and all questions answered. Patient is agreeable for d/c and escorted to ed exit by staff.  

## 2023-07-26 NOTE — ED Notes (Signed)
Patient reports feeling much better after fluids

## 2023-12-21 ENCOUNTER — Emergency Department (HOSPITAL_BASED_OUTPATIENT_CLINIC_OR_DEPARTMENT_OTHER)
Admission: EM | Admit: 2023-12-21 | Discharge: 2023-12-21 | Disposition: A | Discharge status: Home / Self Care | Payer: BLUE CROSS/BLUE SHIELD | Attending: Emergency Medicine | Admitting: Emergency Medicine

## 2023-12-21 ENCOUNTER — Other Ambulatory Visit: Payer: Self-pay

## 2023-12-21 ENCOUNTER — Encounter (HOSPITAL_BASED_OUTPATIENT_CLINIC_OR_DEPARTMENT_OTHER): Payer: Self-pay | Admitting: Emergency Medicine

## 2023-12-21 DIAGNOSIS — R220 Localized swelling, mass and lump, head: Secondary | ICD-10-CM | POA: Diagnosis present

## 2023-12-21 DIAGNOSIS — Z79899 Other long term (current) drug therapy: Secondary | ICD-10-CM | POA: Diagnosis not present

## 2023-12-21 DIAGNOSIS — D72829 Elevated white blood cell count, unspecified: Secondary | ICD-10-CM | POA: Insufficient documentation

## 2023-12-21 DIAGNOSIS — Z7982 Long term (current) use of aspirin: Secondary | ICD-10-CM | POA: Diagnosis not present

## 2023-12-21 DIAGNOSIS — R000 Tachycardia, unspecified: Secondary | ICD-10-CM | POA: Diagnosis not present

## 2023-12-21 DIAGNOSIS — J029 Acute pharyngitis, unspecified: Secondary | ICD-10-CM | POA: Insufficient documentation

## 2023-12-21 LAB — CBC WITH DIFFERENTIAL/PLATELET
Abs Immature Granulocytes: 0.05 10*3/uL (ref 0.00–0.07)
Basophils Absolute: 0.1 10*3/uL (ref 0.0–0.1)
Basophils Relative: 1 %
Eosinophils Absolute: 0.1 10*3/uL (ref 0.0–0.5)
Eosinophils Relative: 1 %
HCT: 41.8 % (ref 36.0–46.0)
Hemoglobin: 13.5 g/dL (ref 12.0–15.0)
Immature Granulocytes: 1 %
Lymphocytes Relative: 24 %
Lymphs Abs: 2.6 10*3/uL (ref 0.7–4.0)
MCH: 29.3 pg (ref 26.0–34.0)
MCHC: 32.3 g/dL (ref 30.0–36.0)
MCV: 90.7 fL (ref 80.0–100.0)
Monocytes Absolute: 0.6 10*3/uL (ref 0.1–1.0)
Monocytes Relative: 5 %
Neutro Abs: 7.3 10*3/uL (ref 1.7–7.7)
Neutrophils Relative %: 68 %
Platelets: 288 10*3/uL (ref 150–400)
RBC: 4.61 MIL/uL (ref 3.87–5.11)
RDW: 12.7 % (ref 11.5–15.5)
WBC: 10.7 10*3/uL — ABNORMAL HIGH (ref 4.0–10.5)
nRBC: 0 % (ref 0.0–0.2)

## 2023-12-21 LAB — BASIC METABOLIC PANEL
Anion gap: 9 (ref 5–15)
BUN: 9 mg/dL (ref 6–20)
CO2: 26 mmol/L (ref 22–32)
Calcium: 8.8 mg/dL — ABNORMAL LOW (ref 8.9–10.3)
Chloride: 103 mmol/L (ref 98–111)
Creatinine, Ser: 0.78 mg/dL (ref 0.44–1.00)
GFR, Estimated: >60 mL/min (ref >60)
Glucose, Bld: 104 mg/dL — ABNORMAL HIGH (ref 70–99)
Potassium: 3.6 mmol/L (ref 3.5–5.1)
Sodium: 138 mmol/L (ref 135–145)

## 2023-12-21 LAB — RESP PANEL BY RT-PCR (RSV, FLU A&B, COVID)  RVPGX2
Influenza A by PCR: NEGATIVE
Influenza B by PCR: NEGATIVE
Resp Syncytial Virus by PCR: NEGATIVE
SARS Coronavirus 2 by RT PCR: NEGATIVE

## 2023-12-21 MED ORDER — METHYLPREDNISOLONE SODIUM SUCC 125 MG IJ SOLR
125.0000 mg | Freq: Once | INTRAMUSCULAR | Status: AC
  Administered 2023-12-21: 125 mg via INTRAVENOUS
  Filled 2023-12-21: qty 2

## 2023-12-21 MED ORDER — FAMOTIDINE IN NACL 20-0.9 MG/50ML-% IV SOLN
20.0000 mg | Freq: Once | INTRAVENOUS | Status: AC
  Administered 2023-12-21: 20 mg via INTRAVENOUS
  Filled 2023-12-21: qty 50

## 2023-12-21 MED ORDER — DIPHENHYDRAMINE HCL 50 MG/ML IJ SOLN
25.0000 mg | Freq: Once | INTRAMUSCULAR | Status: AC
  Administered 2023-12-21: 25 mg via INTRAVENOUS
  Filled 2023-12-21: qty 1

## 2023-12-21 NOTE — ED Provider Notes (Signed)
 [place] EMERGENCY DEPARTMENT AT [facility] Provider Note   CSN: [id number] Arrival date & time: 12/21/23  1034     History  Chief Complaint  Patient presents with   Oral Swelling    Monica Palmer is a 58 y.o. female.  With history of hyperlipidemia, GERD presenting to the ED for evaluation of sore throat.  She states she developed a nonproductive cough approximately 1 week ago.  Last night she developed a sore throat.  When she woke up this morning she felt like her tongue was swollen.  She took a Benadryl and presented to an outside urgent care.  She was encouraged to come to the emergency department for further evaluation.  She denies any shortness of breath, throat swelling, difficulty swallowing.  She states she has 6 grandchildren in her house and they have all been sick recently.  She states she did receive her flu vaccine this year.  She denies any fevers.  She denies chest pain.  No abdominal pain, nausea or vomiting.  No recent medication changes.  She is not on an ACE inhibitor.  HPI     Home Medications Prior to Admission medications   Medication Sig Start Date End Date Taking? Authorizing Provider  albuterol (PROVENTIL) (2.5 MG/3ML) 0.083% nebulizer solution Inhale into the lungs. 06/22/19   [provider]  ALPRAZolam Prudy Feeler) 0.5 MG tablet PLEASE SEE ATTACHED FOR DETAILED DIRECTIONS 07/23/21   [provider]  amphetamine-dextroamphetamine (ADDERALL XR) 15 MG 24 hr capsule Take 1 tablet by mouth daily. 06/26/22   [provider]  aspirin EC 81 MG tablet Take by mouth.    [provider]  azithromycin (ZITHROMAX) 500 MG tablet Take 500 mg by mouth daily. 06/26/22   [provider]  escitalopram (LEXAPRO) 10 MG tablet Take 1 tablet by mouth daily. 10/24/21   [provider]  metoprolol succinate (TOPROL-XL) 50 MG 24 hr tablet Take 1 tablet by mouth 2 (two) times daily. 06/12/22   [provider]   predniSONE (DELTASONE) 10 MG tablet Take by mouth. 06/26/22   [provider]      Allergies    Patient has no known allergies.    Review of Systems   Review of Systems  HENT:  Positive for sore throat.   Respiratory:  Positive for cough.   All other systems reviewed and are negative.   Physical Exam Updated Vital Signs BP 135/74   Pulse 97   Temp 97.9 F (36.6 C) (Oral)   Resp 18   Wt 82.6 kg   SpO2 92%  Physical Exam Vitals and nursing note reviewed.  Constitutional:      General: She is not in acute distress.    Appearance: Normal appearance. She is normal weight. She is not ill-appearing.  HENT:     Head: Normocephalic and atraumatic.     Mouth/Throat:     Mouth: Mucous membranes are moist.     Pharynx: Oropharynx is clear.     Comments: Posterior pharyngeal erythema.  Airway widely patent.  No glossitis.  No lip swelling.  Soft palate rises with phonation. Neck:     Comments: No lymphadenopathy.  Normal ROM. Cardiovascular:     Rate and Rhythm: Regular rhythm. Tachycardia present.  Pulmonary:     Effort: Pulmonary effort is normal. No respiratory distress.     Breath sounds: No stridor. No wheezing, rhonchi or rales.  Chest:     Chest wall: No tenderness.  Abdominal:  General: Abdomen is flat.  Musculoskeletal:        General: Normal range of motion.     Cervical back: Neck supple. No rigidity.  Skin:    General: Skin is warm and dry.  Neurological:     Mental Status: She is alert and oriented to person, place, and time.  Psychiatric:        Mood and Affect: Mood normal.        Behavior: Behavior normal.     ED Results / Procedures / Treatments   Labs (all labs ordered are listed, but only abnormal results are displayed) Labs Reviewed  BASIC METABOLIC PANEL - Abnormal; Notable for the following components:      Result Value   Glucose, Bld 104 (*)    Calcium 8.8 (*)    All other components within normal limits  CBC WITH  DIFFERENTIAL/PLATELET - Abnormal; Notable for the following components:   WBC 10.7 (*)    All other components within normal limits  RESP PANEL BY RT-PCR (RSV, FLU A&B, COVID)  RVPGX2    EKG None  Radiology No results found.  Procedures Procedures    Medications Ordered in ED Medications  diphenhydrAMINE (BENADRYL) injection 25 mg (25 mg Intravenous Given 12/21/23 1123)  famotidine (PEPCID) IVPB 20 mg premix (0 mg Intravenous Stopped 12/21/23 1212)  methylPREDNISolone sodium succinate (SOLU-MEDROL) 125 mg/2 mL injection 125 mg (125 mg Intravenous Given 12/21/23 1123)    ED Course/ Medical Decision Making/ A&P                                 Medical Decision Making Amount and/or Complexity of Data Reviewed Labs: ordered.  Risk Prescription drug management.  This patient presents to the ED for concern of sore throat, tongue swelling, this involves an extensive number of treatment options, and is a complaint that carries with it a high risk of complications and morbidity.  The differential diagnosis includes angioedema, anaphylaxis, allergic reaction, flu, COVID, RSV  My initial workup includes labs, symptom control  Additional history obtained from: Nursing notes from this visit. daughter at bedside  I ordered, reviewed and interpreted labs which include: BMP, CBC, respiratory panel.  Leukocytosis of 10.7.  No electrolyte derangement or kidney dysfunction.  Respiratory panel negative.  58 year old female presenting to the ED for evaluation of sore throat.  States this began last night.  She states her tongue feels swollen today.  She denies any respiratory complaints other than mild intermittent cough which is nonproductive.  Specifically no shortness of breath or feeling like her throat is closing.  On exam she appears very well.  No evidence of angioedema.  She reports improvement in her symptoms after treatment in the emergency department.  Overall her symptoms began  approximately 9 hours prior to arrival.  Very low suspicion for anaphylaxis as well.  She is not on an ACE inhibitor.  She was encouraged to follow-up with her primary care provider.  Overall suspect viral upper respiratory infection.  She was given return precautions.  Stable at discharge.  At this time there does not appear to be any evidence of an acute emergency medical condition and the patient appears stable for discharge with appropriate outpatient follow up. Diagnosis was discussed with patient who verbalizes understanding of care plan and is agreeable to discharge. I have discussed return precautions with patient and daughter who verbalizes understanding. Patient encouraged to follow-up with their PCP  within 1 week. All questions answered.  Patient's case discussed with Dr. Particia Nearing who agrees with plan to discharge with follow-up.   Note: Portions of this report may have been transcribed using voice recognition software. Every effort was made to ensure accuracy; however, inadvertent computerized transcription errors may still be present.        Final Clinical Impression(s) / ED Diagnoses Final diagnoses:  Sore throat    Rx / DC Orders ED Discharge Orders     None         Mora Bellman 12/21/23 1241    Jacalyn Lefevre, MD 12/21/23 [id number]

## 2023-12-21 NOTE — ED Triage Notes (Signed)
 Reports swelling to her tongue started this morning , shortness of breath , had sore throat last night . Coughing .  No obvious resp distress yet reports dizziness.  Alert and oriented x 4 , speaks full sentences .  Marland Kitchen

## 2023-12-21 NOTE — Discharge Instructions (Signed)
 You have been seen today for your complaint of sore throat, tongue swelling sensation. Your lab work was reassuring. Your discharge medications include Alternate tylenol and ibuprofen for pain. You may alternate these every 4 hours. You may take up to 800 mg of ibuprofen at a time and up to 1000 mg of tylenol. You may use warm tea with honey for your sore throat. You may use Robitussin DM, Mucinex DM or Delsym for your cough Follow up with: Your primary care provider in 1 week Please seek immediate medical care if you develop any of the following symptoms: You have difficulty breathing. You cannot swallow fluids, soft foods, or your saliva. You have increased swelling in your throat or neck. You have persistent nausea and vomiting. At this time there does not appear to be the presence of an emergent medical condition, however there is always the potential for conditions to change. Please read and follow the below instructions.  Do not take your medicine if  develop an itchy rash, swelling in your mouth or lips, or difficulty breathing; call 911 and seek immediate emergency medical attention if this occurs.  You may review your lab tests and imaging results in their entirety on your MyChart account.  Please discuss all results of fully with your primary care provider and other specialist at your follow-up visit.  Note: Portions of this text may have been transcribed using voice recognition software. Every effort was made to ensure accuracy; however, inadvertent computerized transcription errors may still be present.

## 2023-12-23 ENCOUNTER — Emergency Department (HOSPITAL_COMMUNITY)
Admission: EM | Admit: 2023-12-23 | Discharge: 2023-12-24 | Discharge status: Against Medical Advice | Payer: BLUE CROSS/BLUE SHIELD | Attending: Emergency Medicine | Admitting: Emergency Medicine

## 2023-12-23 ENCOUNTER — Emergency Department (HOSPITAL_COMMUNITY): Payer: BLUE CROSS/BLUE SHIELD

## 2023-12-23 ENCOUNTER — Other Ambulatory Visit: Payer: Self-pay

## 2023-12-23 DIAGNOSIS — R109 Unspecified abdominal pain: Secondary | ICD-10-CM | POA: Insufficient documentation

## 2023-12-23 DIAGNOSIS — T7840XA Allergy, unspecified, initial encounter: Secondary | ICD-10-CM | POA: Insufficient documentation

## 2023-12-23 DIAGNOSIS — Z5321 Procedure and treatment not carried out due to patient leaving prior to being seen by health care provider: Secondary | ICD-10-CM | POA: Insufficient documentation

## 2023-12-23 DIAGNOSIS — R079 Chest pain, unspecified: Secondary | ICD-10-CM | POA: Diagnosis not present

## 2023-12-23 LAB — CBC WITH DIFFERENTIAL/PLATELET
Abs Immature Granulocytes: 0.07 K/uL (ref 0.00–0.07)
Basophils Absolute: 0.1 K/uL (ref 0.0–0.1)
Basophils Relative: 1 %
Eosinophils Absolute: 0.2 K/uL (ref 0.0–0.5)
Eosinophils Relative: 1 %
HCT: 43.3 % (ref 36.0–46.0)
Hemoglobin: 13.9 g/dL (ref 12.0–15.0)
Immature Granulocytes: 1 %
Lymphocytes Relative: 27 %
Lymphs Abs: 3.6 K/uL (ref 0.7–4.0)
MCH: 29.3 pg (ref 26.0–34.0)
MCHC: 32.1 g/dL (ref 30.0–36.0)
MCV: 91.4 fL (ref 80.0–100.0)
Monocytes Absolute: 0.8 K/uL (ref 0.1–1.0)
Monocytes Relative: 6 %
Neutro Abs: 8.7 K/uL — ABNORMAL HIGH (ref 1.7–7.7)
Neutrophils Relative %: 64 %
Platelets: 332 K/uL (ref 150–400)
RBC: 4.74 MIL/uL (ref 3.87–5.11)
RDW: 12.7 % (ref 11.5–15.5)
WBC: 13.4 K/uL — ABNORMAL HIGH (ref 4.0–10.5)
nRBC: 0 % (ref 0.0–0.2)

## 2023-12-23 LAB — URINALYSIS, W/ REFLEX TO CULTURE (INFECTION SUSPECTED)
Bilirubin Urine: NEGATIVE
Glucose, UA: NEGATIVE mg/dL
Ketones, ur: NEGATIVE mg/dL
Leukocytes,Ua: NEGATIVE
Nitrite: NEGATIVE
Protein, ur: NEGATIVE mg/dL
Specific Gravity, Urine: 1.005 (ref 1.005–1.030)
pH: 7 (ref 5.0–8.0)

## 2023-12-23 LAB — COMPREHENSIVE METABOLIC PANEL
ALT: 17 U/L (ref 0–44)
AST: 18 U/L (ref 15–41)
Albumin: 3.5 g/dL (ref 3.5–5.0)
Alkaline Phosphatase: 89 U/L (ref 38–126)
Anion gap: 10 (ref 5–15)
BUN: 16 mg/dL (ref 6–20)
CO2: 25 mmol/L (ref 22–32)
Calcium: 8.9 mg/dL (ref 8.9–10.3)
Chloride: 106 mmol/L (ref 98–111)
Creatinine, Ser: 0.87 mg/dL (ref 0.44–1.00)
GFR, Estimated: >60 mL/min (ref >60)
Glucose, Bld: 101 mg/dL — ABNORMAL HIGH (ref 70–99)
Potassium: 4 mmol/L (ref 3.5–5.1)
Sodium: 141 mmol/L (ref 135–145)
Total Bilirubin: 0.4 mg/dL (ref 0.0–1.2)
Total Protein: 6.8 g/dL (ref 6.5–8.1)

## 2023-12-23 LAB — LIPASE, BLOOD: Lipase: 47 U/L (ref 11–51)

## 2023-12-23 LAB — TROPONIN I (HIGH SENSITIVITY)
Troponin I (High Sensitivity): 4 ng/L (ref <18)
Troponin I (High Sensitivity): 5 ng/L (ref <18)

## 2023-12-23 LAB — I-STAT CG4 LACTIC ACID, ED: Lactic Acid, Venous: 0.8 mmol/L (ref 0.5–1.9)

## 2023-12-23 MED ORDER — FAMOTIDINE 20 MG PO TABS
20.0000 mg | ORAL_TABLET | Freq: Once | ORAL | Status: AC
  Administered 2023-12-23: 20 mg via ORAL
  Filled 2023-12-23: qty 1

## 2023-12-23 MED ORDER — DIPHENHYDRAMINE HCL 25 MG PO CAPS
50.0000 mg | ORAL_CAPSULE | Freq: Once | ORAL | Status: AC
  Administered 2023-12-23: 50 mg via ORAL
  Filled 2023-12-23: qty 2

## 2023-12-23 MED ORDER — PREDNISONE 20 MG PO TABS
60.0000 mg | ORAL_TABLET | Freq: Once | ORAL | Status: AC
  Administered 2023-12-23: 60 mg via ORAL
  Filled 2023-12-23: qty 3

## 2023-12-23 NOTE — ED Provider Triage Note (Signed)
 Emergency Medicine Provider Triage Evaluation Note  Monica Palmer , a 58 y.o. female  was evaluated in triage.  Pt complains of allergic reaction, chest pain, abdominal pain.  States she has been having intermittent allergic reaction for the last week or so after rubbing oil on her legs.  States that she is having some feeling of tongue swelling that has responded to Benadryl this morning.  Denies any difficulty breathing or difficulty swallowing or feeling of throat closing in on her.  Also reporting left-sided chest pain going to her shoulder that was present this morning.  Also complaining of pain in upper middle abdomen..  Review of Systems  Positive: See above Negative:   Physical Exam  BP (!) 188/117 (BP Location: Right Arm)   Pulse (!) 119   Temp 98.5 F (36.9 C)   Resp (!) 22   Ht 5\' 5"  (1.651 m)   Wt 82.6 kg   SpO2 98%   BMI 30.29 kg/m  Gen:   Awake, no distress   Resp:  Normal effort  MSK:   Moves extremities without difficulty  Other:  No sublingual or extremity but or swelling.  Patient tolerating oral secretions.  No auscultatory stridor.  Medical Decision Making  Medically screening exam initiated at 12:29 PM.  Appropriate orders placed.  Joeanna Howdyshell was informed that the remainder of the evaluation will be completed by another provider, this initial triage assessment does not replace that evaluation, and the importance of remaining in the ED until their evaluation is complete.  Medications ordered.  Will observe patient in triage area for any signs of clinical worsening.   Peter Garter, [place] 12/23/23 1231

## 2023-12-23 NOTE — ED Triage Notes (Signed)
 Husband stated, last week she rubbed Mg.oil On legs due to restless legs really bad. This was Thursday to Monday. On Monday night uncrusted peanut butter and jelly and was having her tongue swollen and face swollen. Given a Benadryl  helped the situation. Went to [facility]. Given solu mederol, Pepcid, and a Benadryl. She was fine. Last night she started swelling again. She took a Benadryl and helped the swelling. Around 900 I gave her a heart burn medicine and was burping like crazy yesterday.  This morning at 0400 this morning started the swelling all over, took another Benadryl . She went back to sleep and decided to come here. She has been also having some dizziness for about 3 weeks.

## 2023-12-23 NOTE — ED Notes (Signed)
 Patient decided to leave AMA. Patient moved OTF.
